# Patient Record
Sex: Female | Born: 1990 | Race: Black or African American | Hispanic: No | Marital: Single | Smoking: Never smoker
Health system: Southern US, Community
[De-identification: ages and names within clinical notes are randomized; demographics above are authoritative.]

---

## 2013-05-04 ENCOUNTER — Encounter (HOSPITAL_COMMUNITY): Payer: Self-pay | Admitting: Emergency Medicine

## 2013-05-04 ENCOUNTER — Emergency Department (HOSPITAL_COMMUNITY)
Admission: EM | Admit: 2013-05-04 | Discharge: 2013-05-05 | Disposition: A | Discharge status: Home / Self Care | Payer: No Typology Code available for payment source | Attending: Emergency Medicine | Admitting: Emergency Medicine

## 2013-05-04 DIAGNOSIS — S6990XA Unspecified injury of unspecified wrist, hand and finger(s), initial encounter: Secondary | ICD-10-CM | POA: Insufficient documentation

## 2013-05-04 DIAGNOSIS — Y92410 Unspecified street and highway as the place of occurrence of the external cause: Secondary | ICD-10-CM | POA: Insufficient documentation

## 2013-05-04 DIAGNOSIS — S8990XA Unspecified injury of unspecified lower leg, initial encounter: Secondary | ICD-10-CM | POA: Insufficient documentation

## 2013-05-04 DIAGNOSIS — S298XXA Other specified injuries of thorax, initial encounter: Secondary | ICD-10-CM | POA: Insufficient documentation

## 2013-05-04 DIAGNOSIS — Y9389 Activity, other specified: Secondary | ICD-10-CM | POA: Insufficient documentation

## 2013-05-04 DIAGNOSIS — S99929A Unspecified injury of unspecified foot, initial encounter: Secondary | ICD-10-CM | POA: Insufficient documentation

## 2013-05-04 DIAGNOSIS — S7010XA Contusion of unspecified thigh, initial encounter: Secondary | ICD-10-CM | POA: Insufficient documentation
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
## 2013-05-05 MED ORDER — CYCLOBENZAPRINE HCL 10 MG PO TABS: 10.0000 mg | ORAL_TABLET | Freq: Two times a day (BID) | ORAL | Status: DC | PRN
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
## 2013-05-05 NOTE — ED Provider Notes (Signed)
Medical screening examination/treatment/procedure(s) were performed by non-physician practitioner and as supervising physician I was immediately available for consultation/collaboration.  Olivia Mackie, MD 05/05/13 [phone]

## 2013-05-05 NOTE — ED Notes (Signed)
Pt denies any pain or questions upon discharge. 

## 2015-11-14 ENCOUNTER — Emergency Department (HOSPITAL_COMMUNITY)
Admission: EM | Admit: 2015-11-14 | Discharge: 2015-11-15 | Disposition: A | Discharge status: Home / Self Care | Payer: Medicaid Other | Attending: Emergency Medicine | Admitting: Emergency Medicine

## 2015-11-14 ENCOUNTER — Encounter (HOSPITAL_COMMUNITY): Payer: Self-pay

## 2015-11-14 ENCOUNTER — Emergency Department (HOSPITAL_COMMUNITY): Payer: Medicaid Other

## 2015-11-14 DIAGNOSIS — S2231XA Fracture of one rib, right side, initial encounter for closed fracture: Secondary | ICD-10-CM | POA: Diagnosis not present

## 2015-11-14 DIAGNOSIS — R55 Syncope and collapse: Secondary | ICD-10-CM | POA: Insufficient documentation

## 2015-11-14 DIAGNOSIS — Z3202 Encounter for pregnancy test, result negative: Secondary | ICD-10-CM | POA: Insufficient documentation

## 2015-11-14 DIAGNOSIS — S3991XA Unspecified injury of abdomen, initial encounter: Secondary | ICD-10-CM | POA: Diagnosis not present

## 2015-11-14 DIAGNOSIS — Y998 Other external cause status: Secondary | ICD-10-CM | POA: Insufficient documentation

## 2015-11-14 DIAGNOSIS — Y9389 Activity, other specified: Secondary | ICD-10-CM | POA: Diagnosis not present

## 2015-11-14 DIAGNOSIS — S29001A Unspecified injury of muscle and tendon of front wall of thorax, initial encounter: Secondary | ICD-10-CM | POA: Diagnosis present

## 2015-11-14 DIAGNOSIS — Y92410 Unspecified street and highway as the place of occurrence of the external cause: Secondary | ICD-10-CM | POA: Diagnosis not present

## 2015-11-14 LAB — I-STAT BETA HCG BLOOD, ED (MC, WL, AP ONLY): I-stat hCG, quantitative: <5 m[IU]/mL (ref <5)

## 2015-11-14 MED ORDER — MORPHINE SULFATE (PF) 4 MG/ML IV SOLN
4.0000 mg | Freq: Once | INTRAVENOUS | Status: AC
  Administered 2015-11-14: 4 mg via INTRAVENOUS
  Filled 2015-11-14: qty 1

## 2015-11-14 MED ORDER — IOHEXOL 300 MG/ML  SOLN
100.0000 mL | Freq: Once | INTRAMUSCULAR | Status: AC | PRN
  Administered 2015-11-14: 100 mL via INTRAVENOUS

## 2015-11-14 MED ORDER — OXYCODONE HCL 5 MG PO TABS: 5.0000 mg | ORAL_TABLET | ORAL | Status: DC | PRN

## 2015-11-14 MED ORDER — LORAZEPAM 2 MG/ML IJ SOLN
0.5000 mg | Freq: Once | INTRAMUSCULAR | Status: AC
  Administered 2015-11-14: 0.5 mg via INTRAVENOUS
  Filled 2015-11-14: qty 1

## 2015-11-14 MED ORDER — ONDANSETRON HCL 4 MG/2ML IJ SOLN
4.0000 mg | Freq: Once | INTRAMUSCULAR | Status: AC
  Administered 2015-11-14: 4 mg via INTRAVENOUS
  Filled 2015-11-14: qty 2

## 2015-11-14 MED ORDER — ONDANSETRON HCL 4 MG/2ML IJ SOLN
4.0000 mg | Freq: Once | INTRAMUSCULAR | Status: AC
Start: 2015-11-14 — End: 2015-11-14
  Administered 2015-11-14: 4 mg via INTRAVENOUS
  Filled 2015-11-14: qty 2

## 2015-11-14 MED ORDER — KETOROLAC TROMETHAMINE 30 MG/ML IJ SOLN
30.0000 mg | Freq: Once | INTRAMUSCULAR | Status: AC
  Administered 2015-11-14: 30 mg via INTRAVENOUS
  Filled 2015-11-14: qty 1

## 2015-11-14 NOTE — ED Notes (Signed)
Dr. Floyd at the bedside.  
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
## 2015-11-14 NOTE — ED Notes (Signed)
Pt taken to CT scan.
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
## 2015-11-14 NOTE — Discharge Instructions (Signed)
Take 4 over the counter ibuprofen tablets 3 times a day or 2 over-the-counter naproxen tablets twice a day for pain. ° °Rib Fracture °A rib fracture is a break or crack in one of the bones of the ribs. The ribs are a group of long, curved bones that wrap around your chest and attach to your spine. They protect your lungs and other organs in the chest cavity. A broken or cracked rib is often painful, but most do not cause other problems. Most rib fractures heal on their own over time. However, rib fractures can be more serious if multiple ribs are broken or if broken ribs move out of place and push against other structures. °CAUSES  °· A direct blow to the chest. For example, this could happen during contact sports, a car accident, or a fall against a hard object. °· Repetitive movements with high force, such as pitching a baseball or having severe coughing spells. °SYMPTOMS  °· Pain when you breathe in or cough. °· Pain when someone presses on the injured area. °DIAGNOSIS  °Your caregiver will perform a physical exam. Various imaging tests may be ordered to confirm the diagnosis and to look for related injuries. These tests may include a chest X-ray, computed tomography (CT), magnetic resonance imaging (MRI), or a bone scan. °TREATMENT  °Rib fractures usually heal on their own in 1-3 months. The longer healing period is often associated with a continued cough or other aggravating activities. During the healing period, pain control is very important. Medication is usually given to control pain. Hospitalization or surgery may be needed for more severe injuries, such as those in which multiple ribs are broken or the ribs have moved out of place.  °HOME CARE INSTRUCTIONS  °· Avoid strenuous activity and any activities or movements that cause pain. Be careful during activities and avoid bumping the injured rib. °· Gradually increase activity as directed by your caregiver. °· Only take over-the-counter or prescription  medications as directed by your caregiver. Do not take other medications without asking your caregiver first. °· Apply ice to the injured area for the first 1-2 days after you have been treated or as directed by your caregiver. Applying ice helps to reduce inflammation and pain. °¨ Put ice in a plastic bag. °¨ Place a towel between your skin and the bag.   °¨ Leave the ice on for 15-20 minutes at a time, every 2 hours while you are awake. °· Perform deep breathing as directed by your caregiver. This will help prevent pneumonia, which is a common complication of a broken rib. Your caregiver may instruct you to: °¨ Take deep breaths several times a day. °¨ Try to cough several times a day, holding a pillow against the injured area. °¨ Use a device called an incentive spirometer to practice deep breathing several times a day. °· Drink enough fluids to keep your urine clear or pale yellow. This will help you avoid constipation.   °· Do not wear a rib belt or binder. These restrict breathing, which can lead to pneumonia.   °SEEK IMMEDIATE MEDICAL CARE IF:  °· You have a fever.   °· You have difficulty breathing or shortness of breath.   °· You develop a continual cough, or you cough up thick or bloody sputum. °· You feel sick to your stomach (nausea), throw up (vomit), or have abdominal pain.   °· You have worsening pain not controlled with medications.   °MAKE SURE YOU: °· Understand these instructions. °· Will watch your condition. °·   Will get help right away if you are not doing well or get worse. °  °This information is not intended to replace advice given to you by your health care provider. Make sure you discuss any questions you have with your health care provider. °  °Document Released: 09/04/2005 Document Revised: 05/07/2013 Document Reviewed: 11/06/2012 °Elsevier Interactive Patient Education ©2016 [employer] ° °
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
## 2023-08-30 ENCOUNTER — Ambulatory Visit
Admission: EM | Admit: 2023-08-30 | Discharge: 2023-08-30 | Disposition: A | Discharge status: Home / Self Care | Payer: Medicaid Other | Attending: Emergency Medicine | Admitting: Emergency Medicine

## 2023-08-30 DIAGNOSIS — J020 Streptococcal pharyngitis: Secondary | ICD-10-CM

## 2023-08-30 LAB — POCT RAPID STREP A (OFFICE): Rapid Strep A Screen: POSITIVE — AB

## 2023-08-30 MED ORDER — AMOXICILLIN 500 MG PO CAPS: 1000.0000 mg | ORAL_CAPSULE | Freq: Every day | ORAL | 0 refills | Status: AC

## 2023-08-30 NOTE — ED Provider Notes (Signed)
Renaldo Fiddler    CSN: [id number] Arrival date & time: 08/30/23  1330    HISTORY   Chief Complaint  Patient presents with   Sore Throat   HPI Meghan Richard is a pleasant, 32 y.o. female who presents to urgent care today. Patient complains of a 1 day history of sore throat and pain with swallowing.  Patient states that while trying to eat breakfast this morning, the pain in her throat was similar to "swallowing a brick".  Patient denies headache, nausea, rhinorrhea, fever, body aches, chills, diarrhea, known sick contacts.  The history is provided by the patient.   History reviewed. No pertinent past medical history. There are no active problems to display for this patient.  History reviewed. No pertinent surgical history. OB History   No obstetric history on file.    Home Medications    Prior to Admission medications   Medication Sig Start Date End Date Taking? Authorizing Provider  cyclobenzaprine (FLEXERIL) 10 MG tablet Take 1 tablet (10 mg total) by mouth 2 (two) times daily as needed for muscle spasms. Patient not taking: Reported on 11/14/2015 05/05/13   Sciacca, Ashok Cordia, PA-C  oxyCODONE (ROXICODONE) 5 MG immediate release tablet Take 1 tablet (5 mg total) by mouth every 4 (four) hours as needed for severe pain. 11/14/15   Melene Plan, DO    Family History History reviewed. No pertinent family history. Social History Social History   Tobacco Use   Smoking status: Never   Smokeless tobacco: Never  Substance Use Topics   Alcohol use: Yes    Comment: social   Drug use: No   Allergies   Patient has no known allergies.  Review of Systems Review of Systems Pertinent findings revealed after performing a 14 point review of systems has been noted in the history of present illness.  Physical Exam Vital Signs BP 125/72 (BP Location: Left Arm)   Pulse 89   Temp 99.5 F (37.5 C) (Oral)   Resp 18   LMP 08/18/2023 (Approximate)   SpO2 99%   No data  found.  Physical Exam Vitals and nursing note reviewed.  Constitutional:      General: She is awake. She is not in acute distress.    Appearance: Normal appearance. She is well-developed and well-groomed. She is ill-appearing. She is not toxic-appearing.  HENT:     Head: Normocephalic and atraumatic.     Salivary Glands: Right salivary gland is diffusely enlarged and tender. Left salivary gland is diffusely enlarged and tender.     Right Ear: Hearing and external ear normal. A middle ear effusion is present. Tympanic membrane is erythematous. Tympanic membrane is not injected or bulging.     Left Ear: Hearing, tympanic membrane, ear canal and external ear normal.  No middle ear effusion. Tympanic membrane is not injected, erythematous or bulging.     Ears:     Comments: Right EAC with erythema    Nose: No mucosal edema, congestion or rhinorrhea.     Right Turbinates: Not enlarged, swollen or pale.     Left Turbinates: Not enlarged or swollen.     Right Sinus: No maxillary sinus tenderness or frontal sinus tenderness.     Left Sinus: No maxillary sinus tenderness or frontal sinus tenderness.     Mouth/Throat:     Lips: Pink. No lesions.     Mouth: Mucous membranes are moist. No oral lesions or angioedema.     Dentition: No gingival swelling.  Tongue: No lesions.     Palate: No mass.     Pharynx: Uvula midline. Pharyngeal swelling, oropharyngeal exudate and posterior oropharyngeal erythema present. No uvula swelling.     Tonsils: Tonsillar exudate present. 2+ on the right. 2+ on the left.  Eyes:     Extraocular Movements: Extraocular movements intact.     Conjunctiva/sclera: Conjunctivae normal.     Pupils: Pupils are equal, round, and reactive to light.  Neck:     Thyroid: No thyroid mass, thyromegaly or thyroid tenderness.     Trachea: Tracheal tenderness present. No abnormal tracheal secretions or tracheal deviation.     Comments: Voice is muffled Cardiovascular:     Rate and  Rhythm: Normal rate and regular rhythm.     Pulses: Normal pulses.     Heart sounds: Normal heart sounds, S1 normal and S2 normal. No murmur heard.    No friction rub. No gallop.  Pulmonary:     Effort: Pulmonary effort is normal. No accessory muscle usage, prolonged expiration, respiratory distress or retractions.     Breath sounds: No stridor, decreased air movement or transmitted upper airway sounds. No decreased breath sounds, wheezing, rhonchi or rales.  Abdominal:     General: Bowel sounds are normal.     Palpations: Abdomen is soft.     Tenderness: There is generalized abdominal tenderness. There is no right CVA tenderness, left CVA tenderness or rebound. Negative signs include Murphy's sign.     Hernia: No hernia is present.  Musculoskeletal:        General: No tenderness. Normal range of motion.     Cervical back: Full passive range of motion without pain, normal range of motion and neck supple.     Right lower leg: No edema.     Left lower leg: No edema.  Lymphadenopathy:     Cervical: Cervical adenopathy present.     Right cervical: Superficial cervical adenopathy present.     Left cervical: Superficial cervical adenopathy present.  Skin:    General: Skin is warm and dry.     Findings: No erythema, lesion or rash.  Neurological:     General: No focal deficit present.     Mental Status: She is alert and oriented to person, place, and time. Mental status is at baseline.  Psychiatric:        Mood and Affect: Mood normal.        Behavior: Behavior normal. Behavior is cooperative.        Thought Content: Thought content normal.        Judgment: Judgment normal.     Visual Acuity Right Eye Distance:   Left Eye Distance:   Bilateral Distance:    Right Eye Near:   Left Eye Near:    Bilateral Near:     [facility] Couse / Diagnostics / Procedures:     Radiology No results found.  Procedures Procedures (including critical care time) EKG  Pending results:  Labs Reviewed   POCT RAPID STREP A (OFFICE) - Abnormal; Notable for the following components:      Result Value   Rapid Strep A Screen Positive (*)    All other components within normal limits    Medications Ordered in UC: Medications - No data to display  UC Diagnoses / Final Clinical Impressions(s)   I have reviewed the triage vital signs and the nursing notes.  Pertinent labs & imaging results that were available during my care of the patient were reviewed by me  and considered in my medical decision making (see chart for details).    Final diagnoses:  Acute streptococcal pharyngitis   Rapid strep test today is positive.  Patient provided with a 10-day course of amoxicillin 1 g daily.  Patient advised can use ibuprofen and Chloraseptic throat spray for pain relief.  Conservative care recommended.  Return precautions advised.  Please see discharge instructions below for details of plan of care as provided to patient. ED Prescriptions     Medication Sig Dispense Auth. Provider   amoxicillin (AMOXIL) 500 MG capsule Take 2 capsules (1,000 mg total) by mouth daily for 10 days. 20 capsule Theadora Rama Scales, PA-C      PDMP not reviewed this encounter.  Pending results:  Labs Reviewed  POCT RAPID STREP A (OFFICE) - Abnormal; Notable for the following components:      Result Value   Rapid Strep A Screen Positive (*)    All other components within normal limits      Discharge Instructions      Your strep test today is positive.  I recommend that you begin antibiotics now for treatment.  I have sent a prescription to your pharmacy.  Please take them as prescribed.  You will begin to feel better in about 24 hours.  Please be sure that you you finish the entire 10-day course of treatment to avoid worsening infection that may require longer treatment with stronger antibiotics.   After 24 hours, please discard your toothbrush as well as any other oral devices that you are currently using and  replace them with new ones to avoid reinfection.  Also after 24 hours, you will no longer be contagious.    Please read below to learn more about the medications, dosages and frequencies that I recommend to help alleviate your symptoms and to get you feeling better soon:   Amoxicillin:  Please take one (1) dose twice daily for 10 days.  This antibiotic can cause upset stomach, this will resolve once antibiotics are complete.  You are welcome to take a probiotic, eat yogurt, take Imodium while taking this medication.  Please avoid other systemic medications such as Maalox, Pepto-Bismol or milk of magnesia as they can interfere with the body's ability to absorb the antibiotics.   Advil, Motrin (ibuprofen): This is a good anti-inflammatory medication which addresses aches, pains and inflammation of the upper airways that causes sinus and nasal congestion as well as in the lower airways which makes your cough feel tight and sometimes burn.  I recommend that you take between 400 to 600 mg every 6-8 hours as needed.  Please do not take more than 2400 mg of ibuprofen in a 24-hour period and please do not take high doses of ibuprofen for more than 3 days in a row as this can lead to stomach ulcers.   Chloraseptic Throat Spray: Spray 5 sprays into affected area every 2 hours, hold for 15 seconds and either swallow or spit it out.  This is a excellent numbing medication because it is a spray, you can put it right where you needed and so sucking on a lozenge and numbing your entire mouth.      If symptoms have not meaningfully improved in the next 5 to 7 days, please return for repeat evaluation or follow-up with your regular provider.  If symptoms have worsened in the next 3 to 5 days, please return for repeat evaluation or follow-up with your regular provider.    Thank you for  visiting urgent care today.  We appreciate the opportunity to participate in your care.       Disposition Upon Discharge:   Condition: stable for discharge home  Patient presented with an acute illness with associated systemic symptoms and significant discomfort requiring urgent management. In my opinion, this is a condition that a prudent lay person (someone who possesses an average knowledge of health and medicine) may potentially expect to result in complications if not addressed urgently such as respiratory distress, impairment of bodily function or dysfunction of bodily organs.   Routine symptom specific, illness specific and/or disease specific instructions were discussed with the patient and/or caregiver at length.   As such, the patient has been evaluated and assessed, work-up was performed and treatment was provided in alignment with urgent care protocols and evidence based medicine.  Patient/parent/caregiver has been advised that the patient may require follow up for further testing and treatment if the symptoms continue in spite of treatment, as clinically indicated and appropriate.  Patient/parent/caregiver has been advised to return to the [facility] or PCP if no better; to PCP or the Emergency Department if new signs and symptoms develop, or if the current signs or symptoms continue to change or worsen for further workup, evaluation and treatment as clinically indicated and appropriate  The patient will follow up with their current PCP if and as advised. If the patient does not currently have a PCP we will assist them in obtaining one.   The patient may need specialty follow up if the symptoms continue, in spite of conservative treatment and management, for further workup, evaluation, consultation and treatment as clinically indicated and appropriate.  Patient/parent/caregiver verbalized understanding and agreement of plan as discussed.  All questions were addressed during visit.  Please see discharge instructions below for further details of plan.  This office note has been dictated using Engineer, structural.  Unfortunately, this method of dictation can sometimes lead to typographical or grammatical errors.  I apologize for your inconvenience in advance if this occurs.  Please do not hesitate to reach out to me if clarification is needed.      Theadora Rama Scales, PA-C 08/30/23 1452

## 2023-08-30 NOTE — ED Triage Notes (Signed)
Sore throat that started yesterday. Not taking any OTC medication today.

## 2023-08-30 NOTE — Discharge Instructions (Signed)
Your strep test today is positive.  I recommend that you begin antibiotics now for treatment.  I have sent a prescription to your pharmacy.  Please take them as prescribed.  You will begin to feel better in about 24 hours.  Please be sure that you you finish the entire 10-day course of treatment to avoid worsening infection that may require longer treatment with stronger antibiotics.   After 24 hours, please discard your toothbrush as well as any other oral devices that you are currently using and replace them with new ones to avoid reinfection.  Also after 24 hours, you will no longer be contagious.    Please read below to learn more about the medications, dosages and frequencies that I recommend to help alleviate your symptoms and to get you feeling better soon:   Amoxicillin:  Please take one (1) dose twice daily for 10 days.  This antibiotic can cause upset stomach, this will resolve once antibiotics are complete.  You are welcome to take a probiotic, eat yogurt, take Imodium while taking this medication.  Please avoid other systemic medications such as Maalox, Pepto-Bismol or milk of magnesia as they can interfere with the body's ability to absorb the antibiotics.   Advil, Motrin (ibuprofen): This is a good anti-inflammatory medication which addresses aches, pains and inflammation of the upper airways that causes sinus and nasal congestion as well as in the lower airways which makes your cough feel tight and sometimes burn.  I recommend that you take between 400 to 600 mg every 6-8 hours as needed.  Please do not take more than 2400 mg of ibuprofen in a 24-hour period and please do not take high doses of ibuprofen for more than 3 days in a row as this can lead to stomach ulcers.   Chloraseptic Throat Spray: Spray 5 sprays into affected area every 2 hours, hold for 15 seconds and either swallow or spit it out.  This is a excellent numbing medication because it is a spray, you can put it right where you  needed and so sucking on a lozenge and numbing your entire mouth.      If symptoms have not meaningfully improved in the next 5 to 7 days, please return for repeat evaluation or follow-up with your regular provider.  If symptoms have worsened in the next 3 to 5 days, please return for repeat evaluation or follow-up with your regular provider.    Thank you for visiting urgent care today.  We appreciate the opportunity to participate in your care.
